# Patient Record
Sex: Female | Born: 1970 | Race: White | Hispanic: No | Marital: Married | Smoking: Never smoker
Health system: Southern US, Community
[De-identification: ages and names within clinical notes are randomized; demographics above are authoritative.]

## PROBLEM LIST (undated history)

## (undated) DIAGNOSIS — E063 Autoimmune thyroiditis: Secondary | ICD-10-CM

## (undated) DIAGNOSIS — N289 Disorder of kidney and ureter, unspecified: Secondary | ICD-10-CM

## (undated) HISTORY — PX: KNEE SURGERY: SHX244

## (undated) HISTORY — PX: SHOULDER SURGERY: SHX246

---

## 2006-04-19 ENCOUNTER — Ambulatory Visit (HOSPITAL_COMMUNITY): Admission: RE | Admit: 2006-04-19 | Discharge: 2006-04-19 | Payer: Self-pay

## 2006-04-21 ENCOUNTER — Ambulatory Visit (HOSPITAL_COMMUNITY): Admission: RE | Admit: 2006-04-21 | Discharge: 2006-04-21 | Payer: Self-pay

## 2006-05-03 ENCOUNTER — Ambulatory Visit (HOSPITAL_COMMUNITY): Admission: RE | Admit: 2006-05-03 | Discharge: 2006-05-03 | Payer: Self-pay

## 2006-05-19 ENCOUNTER — Ambulatory Visit (HOSPITAL_COMMUNITY): Admission: RE | Admit: 2006-05-19 | Discharge: 2006-05-19 | Payer: Self-pay

## 2007-09-17 ENCOUNTER — Encounter: Admission: RE | Admit: 2007-09-17 | Discharge: 2007-09-17 | Payer: Self-pay | Admitting: Sports Medicine

## 2007-10-09 ENCOUNTER — Encounter: Admission: RE | Admit: 2007-10-09 | Discharge: 2007-10-09 | Payer: Self-pay | Admitting: Sports Medicine

## 2008-01-19 ENCOUNTER — Emergency Department (HOSPITAL_COMMUNITY): Admission: EM | Admit: 2008-01-19 | Discharge: 2008-01-19 | Payer: Self-pay | Admitting: Emergency Medicine

## 2008-01-28 ENCOUNTER — Ambulatory Visit (HOSPITAL_COMMUNITY): Admission: RE | Admit: 2008-01-28 | Discharge: 2008-01-28 | Payer: Self-pay | Admitting: Urology

## 2008-03-21 ENCOUNTER — Encounter: Admission: RE | Admit: 2008-03-21 | Discharge: 2008-03-21 | Payer: Self-pay | Admitting: Sports Medicine

## 2009-03-15 DIAGNOSIS — S6990XA Unspecified injury of unspecified wrist, hand and finger(s), initial encounter: Secondary | ICD-10-CM

## 2009-03-15 DIAGNOSIS — S6980XA Other specified injuries of unspecified wrist, hand and finger(s), initial encounter: Secondary | ICD-10-CM | POA: Insufficient documentation

## 2009-03-30 DIAGNOSIS — A311 Cutaneous mycobacterial infection: Secondary | ICD-10-CM

## 2009-04-22 ENCOUNTER — Encounter (INDEPENDENT_AMBULATORY_CARE_PROVIDER_SITE_OTHER): Payer: Self-pay | Admitting: *Deleted

## 2009-04-27 ENCOUNTER — Ambulatory Visit: Payer: Self-pay | Admitting: Infectious Diseases

## 2009-04-27 DIAGNOSIS — E063 Autoimmune thyroiditis: Secondary | ICD-10-CM | POA: Insufficient documentation

## 2009-04-27 DIAGNOSIS — Z87442 Personal history of urinary calculi: Secondary | ICD-10-CM | POA: Insufficient documentation

## 2010-03-28 ENCOUNTER — Encounter: Payer: Self-pay | Admitting: Urology

## 2010-04-06 NOTE — Miscellaneous (Signed)
Summary: Problems and Allergies Updated  Clinical Lists Changes  Problems: Added new problem of INJURY OTHER AND UNSPECIFIED FINGER (ICD-959.5) - right index finger Allergies: Added new allergy or adverse reaction of * GLUTEN Observations: Added new observation of NKA: F (04/22/2009 9:50)  Phone call to Dr. Ronie Spies office requesting culture results when available, medications and most recent o\OV notes. Jennet Maduro RN  April 22, 2009 10:06 AM

## 2010-04-06 NOTE — Miscellaneous (Signed)
Summary: HIPAA Restrictions  HIPAA Restrictions   Imported By: Florinda Marker 04/27/2009 14:56:34  _____________________________________________________________________  External Attachment:    Type:   Image     Comment:   External Document

## 2010-04-06 NOTE — Assessment & Plan Note (Signed)
Summary: new pt mycobacterium infection   CC:  nw patient / mycobacterium infection.  History of Present Illness: 40 yo F with hx of injury to R index finger 03-15-09 while shucking oysters. She underwent I & D of this on 03-30-09. Cx's all negative. Has some limitation of movement. Has some persistent soreness and tingling. Some distal purplish since surgery. Never had any proximal swelling or erythema. initially had swelling and redness, no ability to flex. No fever or chills. Had x-ray and was noted to have retained pieces of shell in her skin. Took 5 days of augmentin no improvement after having wound closed without irrigation/debridement per pt.   Preventive Screening-Counseling & Management  Alcohol-Tobacco     Alcohol drinks/day: occassionally     Alcohol type: mixed drink     Smoking Status: never  Caffeine-Diet-Exercise     Caffeine use/day: 0     Does Patient Exercise: yes     Type of exercise: walking     Exercise (avg: min/session): 30-60     Times/week: 5  Safety-Violence-Falls     Seat Belt Use: yes   Current Allergies (reviewed today): ! * GLUTEN ! 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  Married has german shepperd.   Review of Systems       lost 30#, nl BM, nl urination, no lymphadenopathy   Vital Signs:  Patient profile:   40 year old female Height:      61 inches (154.94 cm) Weight:      226.7 pounds (103.05 kg) BMI:     42.99 Temp:     97.7 degrees F (36.50 degrees C) oral Pulse rate:   66 / minute BP sitting:   107 / 77  (left arm)  Vitals Entered By: Baxter Hire) (April 27, 2009 9:39 AM) CC: nw patient / mycobacterium infection Pain Assessment Patient in pain? yes     Location: right index Intensity: 4 Type: soreness,sharp Onset of pain  Constant  tingling Nutritional Status BMI of > 30 = obese Nutritional Status Detail appetite is okay per patient  Does patient need assistance? Functional Status Self care Ambulation Normal   Physical Exam  General:  well-developed, well-nourished, and well-hydrated.   Eyes:  pupils equal, pupils round, and pupils reactive to light.   Mouth:  pharynx pink and moist and no exudates.   Neck:  no masses.   Lungs:  normal respiratory effort and normal breath sounds.   Heart:  normal rate, regular rhythm, and no murmur.   Abdomen:  soft, non-tender, and normal bowel sounds.   Extremities:  no edema.  R index finger- mild swelling, no tenderness or fluctuance, no proximal LAN. hyperesthesia.    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 Explained to pt difficulty in growing this organism and un-likelihood that cx will grow. cautioned her re: sun exposure, OCP use while on antibiotics. await final on cx, so far ngtd. she will return to clinic 2-3 months.   Orders: Consultation Level IV ([address])  Medications Added to Medication List This Visit: 1)  Doxycycline Hyclate 100 Mg Caps (Doxycycline hyclate) .... Take 1 tablet by mouth two times a day Prescriptions: DOXYCYCLINE HYCLATE 100 MG CAPS (DOXYCYCLINE HYCLATE) Take 1 tablet  by mouth two times a day  #60 x 2   Entered and Authorized by:   Johny Sax MD   Signed by:   Johny Sax MD on 04/27/2009   Method used:   Electronically to        [facility] [address] [phone]* (retail)       [address]       [place], [place]  [address]       Ph: [phone] or [phone]       Fax: [phone]   RxID:   [id number]

## 2010-04-27 ENCOUNTER — Other Ambulatory Visit: Payer: Self-pay | Admitting: Otolaryngology

## 2010-04-27 DIAGNOSIS — J3800 Paralysis of vocal cords and larynx, unspecified: Secondary | ICD-10-CM

## 2010-04-27 DIAGNOSIS — E049 Nontoxic goiter, unspecified: Secondary | ICD-10-CM

## 2010-04-28 ENCOUNTER — Ambulatory Visit
Admission: RE | Admit: 2010-04-28 | Discharge: 2010-04-28 | Disposition: A | Discharge status: Home / Self Care | Payer: BC Managed Care – PPO | Source: Ambulatory Visit | Attending: Otolaryngology | Admitting: Otolaryngology

## 2010-04-28 ENCOUNTER — Inpatient Hospital Stay: Admission: RE | Admit: 2010-04-28 | Payer: Self-pay | Source: Ambulatory Visit

## 2010-04-28 DIAGNOSIS — E049 Nontoxic goiter, unspecified: Secondary | ICD-10-CM

## 2010-04-28 DIAGNOSIS — J3800 Paralysis of vocal cords and larynx, unspecified: Secondary | ICD-10-CM

## 2010-05-05 ENCOUNTER — Other Ambulatory Visit: Payer: Self-pay | Admitting: Otolaryngology

## 2010-05-05 DIAGNOSIS — E041 Nontoxic single thyroid nodule: Secondary | ICD-10-CM

## 2010-05-12 ENCOUNTER — Other Ambulatory Visit: Payer: Self-pay | Admitting: Interventional Radiology

## 2010-05-12 ENCOUNTER — Other Ambulatory Visit (HOSPITAL_COMMUNITY)
Admission: RE | Admit: 2010-05-12 | Discharge: 2010-05-12 | Disposition: A | Discharge status: Home / Self Care | Payer: BC Managed Care – PPO | Source: Ambulatory Visit | Attending: Interventional Radiology | Admitting: Interventional Radiology

## 2010-05-12 ENCOUNTER — Ambulatory Visit
Admission: RE | Admit: 2010-05-12 | Discharge: 2010-05-12 | Disposition: A | Discharge status: Home / Self Care | Payer: BC Managed Care – PPO | Source: Ambulatory Visit | Attending: Otolaryngology | Admitting: Otolaryngology

## 2010-05-12 DIAGNOSIS — E041 Nontoxic single thyroid nodule: Secondary | ICD-10-CM

## 2010-05-12 DIAGNOSIS — E049 Nontoxic goiter, unspecified: Secondary | ICD-10-CM | POA: Insufficient documentation

## 2010-12-08 LAB — PREGNANCY, URINE: Preg Test, Ur: NEGATIVE

## 2017-02-08 ENCOUNTER — Emergency Department (HOSPITAL_COMMUNITY): Payer: BLUE CROSS/BLUE SHIELD

## 2017-02-08 ENCOUNTER — Emergency Department (HOSPITAL_COMMUNITY)
Admission: EM | Admit: 2017-02-08 | Discharge: 2017-02-08 | Disposition: A | Discharge status: Home / Self Care | Payer: BLUE CROSS/BLUE SHIELD | Attending: Emergency Medicine | Admitting: Emergency Medicine

## 2017-02-08 ENCOUNTER — Encounter (HOSPITAL_COMMUNITY): Payer: Self-pay | Admitting: Emergency Medicine

## 2017-02-08 DIAGNOSIS — N12 Tubulo-interstitial nephritis, not specified as acute or chronic: Secondary | ICD-10-CM | POA: Diagnosis not present

## 2017-02-08 DIAGNOSIS — R109 Unspecified abdominal pain: Secondary | ICD-10-CM | POA: Diagnosis present

## 2017-02-08 LAB — CBC
HCT: 37 % (ref 36.0–46.0)
HEMOGLOBIN: 12.5 g/dL (ref 12.0–15.0)
MCH: 30.5 pg (ref 26.0–34.0)
MCHC: 33.8 g/dL (ref 30.0–36.0)
MCV: 90.2 fL (ref 78.0–100.0)
Platelets: 292 10*3/uL (ref 150–400)
RBC: 4.1 MIL/uL (ref 3.87–5.11)
RDW: 13.4 % (ref 11.5–15.5)
WBC: 21.3 10*3/uL — ABNORMAL HIGH (ref 4.0–10.5)

## 2017-02-08 LAB — COMPREHENSIVE METABOLIC PANEL
ALT: 30 U/L (ref 14–54)
ANION GAP: 8 (ref 5–15)
AST: 24 U/L (ref 15–41)
Albumin: 3.7 g/dL (ref 3.5–5.0)
Alkaline Phosphatase: 67 U/L (ref 38–126)
BUN: 12 mg/dL (ref 6–20)
CALCIUM: 9.5 mg/dL (ref 8.9–10.3)
CHLORIDE: 103 mmol/L (ref 101–111)
CO2: 20 mmol/L — AB (ref 22–32)
Creatinine, Ser: 0.71 mg/dL (ref 0.44–1.00)
GFR calc non Af Amer: >60 mL/min (ref >60)
Glucose, Bld: 151 mg/dL — ABNORMAL HIGH (ref 65–99)
POTASSIUM: 3.8 mmol/L (ref 3.5–5.1)
SODIUM: 131 mmol/L — AB (ref 135–145)
Total Bilirubin: 1.6 mg/dL — ABNORMAL HIGH (ref 0.3–1.2)
Total Protein: 6.8 g/dL (ref 6.5–8.1)

## 2017-02-08 LAB — URINALYSIS, ROUTINE W REFLEX MICROSCOPIC
BILIRUBIN URINE: NEGATIVE
GLUCOSE, UA: NEGATIVE mg/dL
KETONES UR: NEGATIVE mg/dL
Nitrite: NEGATIVE
PH: 6 (ref 5.0–8.0)
Protein, ur: 100 mg/dL — AB
SPECIFIC GRAVITY, URINE: 1.014 (ref 1.005–1.030)

## 2017-02-08 LAB — LIPASE, BLOOD: LIPASE: 23 U/L (ref 11–51)

## 2017-02-08 LAB — POC URINE PREG, ED: Preg Test, Ur: NEGATIVE

## 2017-02-08 MED ORDER — DEXTROSE 5 % IV SOLN
1.0000 g | Freq: Once | INTRAVENOUS | Status: AC
  Administered 2017-02-08: 1 g via INTRAVENOUS
  Filled 2017-02-08: qty 10

## 2017-02-08 MED ORDER — SODIUM CHLORIDE 0.9 % IV BOLUS (SEPSIS)
1000.0000 mL | Freq: Once | INTRAVENOUS | Status: AC
  Administered 2017-02-08: 1000 mL via INTRAVENOUS

## 2017-02-08 MED ORDER — ONDANSETRON 8 MG PO TBDP: 8.0000 mg | ORAL_TABLET | Freq: Three times a day (TID) | ORAL | 0 refills | Status: DC | PRN

## 2017-02-08 MED ORDER — KETOROLAC TROMETHAMINE 30 MG/ML IJ SOLN
30.0000 mg | Freq: Once | INTRAMUSCULAR | Status: AC
  Administered 2017-02-08: 30 mg via INTRAVENOUS
  Filled 2017-02-08: qty 1

## 2017-02-08 MED ORDER — CEPHALEXIN 500 MG PO CAPS: 500.0000 mg | ORAL_CAPSULE | Freq: Four times a day (QID) | ORAL | 0 refills | Status: DC

## 2017-02-08 MED ORDER — ONDANSETRON 4 MG PO TBDP: 4.0000 mg | ORAL_TABLET | Freq: Once | ORAL | Status: DC | PRN

## 2017-02-08 MED ORDER — ONDANSETRON HCL 4 MG/2ML IJ SOLN
4.0000 mg | Freq: Once | INTRAMUSCULAR | Status: AC
  Administered 2017-02-08: 4 mg via INTRAVENOUS
  Filled 2017-02-08: qty 2

## 2017-02-08 NOTE — ED Provider Notes (Signed)
[facility]-EMERGENCY DEPT Provider Note   CSN: [id number] Arrival date & time: 02/08/17  0913     History   Chief Complaint Chief Complaint  Patient presents with  . Flank Pain    HPI Denise ScalesSherrie Sellers is a 46 y.o. female.  HPI   Denise Sellers is a 46 y.o. female, with a history of Hashimoto's thyroiditis and kidney stones, presenting to the ED with left lower back and flank discomfort accompanied by nausea and vomiting beginning last night. N/V beginning again last night with about 5 episodes of emesis last 24 hours. Has left lower back discomfort, "like someone is tapping on the back" or squeezing, 6/10, radiating to the buttocks, waxes and wanes.  States she has had kidney stones before, but her current symptoms are more consistent with her recent infection. Endorses productive cough and body aches for the past week.   Recent history: November 17: patient began with nausea and vomiting November 19: patient was [place] office in [place], dx with "bilateral kidney infection."  States pyelonephritis sounds familiar. Was prescribed cipro, took for 2 days, made her nauseous, switched to amox, took for 10 days.  November 23: Patient was reexamined at the PCP office and they stated she showed improvement.   November 28: States labs were retested and signs of infection had cleared. November 30: Finished antibiotic therapy.  Denies abdominal pain, fever/chills, diarrhea, hematochezia/melena, shortness of breath, chest pain, dysuria, hematuria, or any other complaints.    History reviewed. No pertinent past medical history.  Patient Active Problem List   Diagnosis Date Noted  . HASHIMOTO'S THYROIDITIS 04/27/2009  . NEPHROLITHIASIS, HX OF 04/27/2009  . 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 INJURY OTHER AND UNSPECIFIED FINGER 03/15/2009    Past Surgical History:  Procedure Laterality Date  . CESAREAN SECTION    . KNEE SURGERY     . SHOULDER SURGERY      OB History    No data available       Home Medications    Prior to Admission medications   Medication Sig Start Date End Date Taking? Authorizing Provider  ibuprofen (ADVIL,MOTRIN) 200 MG tablet Take 200 mg by mouth every 6 (six) hours as needed for fever, headache, mild pain, moderate pain or cramping.   Yes [provider]  cephALEXin (KEFLEX) 500 MG capsule Take 1 capsule (500 mg total) by mouth 4 (four) times daily for 10 days. 02/08/17 02/18/17  Devonda Pequignot C, PA-C  ondansetron (ZOFRAN ODT) 8 MG disintegrating tablet Take 1 tablet (8 mg total) by mouth every 8 (eight) hours as needed for nausea or vomiting. 02/08/17   Anselm PancoastJoy, Joeli Fenner C, PA-C    Family History No family history on file.  Social History Social History   Tobacco Use  . Smoking status: Not on file  Substance Use Topics  . Alcohol use: Not on file  . Drug use: Not on file     Allergies   Acetaminophen; Gluten meal; Ciprofloxacin; Dairy aid [lactase]; and Eggs or egg-derived products   Review of Systems Review of Systems  Constitutional: Negative for chills and fever.  Respiratory: Positive for cough. Negative for shortness of breath.   Cardiovascular: Negative for chest pain.  Gastrointestinal: Positive for nausea and vomiting. Negative for abdominal pain, blood in stool and diarrhea.  Genitourinary: Positive for flank pain. Negative for dysuria and hematuria.  Musculoskeletal: Positive for back pain and myalgias.  Neurological: Negative for weakness and numbness.  All other systems reviewed and are negative.    Physical Exam Updated Vital Signs BP 110/78 (BP Location: Left Arm)   Pulse (!) 108   Temp 99.7 F (37.6 C) (Oral)   Resp 18   LMP 01/30/2017   SpO2 95%   Physical Exam  Constitutional: She appears well-developed and well-nourished. No distress.  Patient is sitting up in the bed in no apparent distress.  HENT:  Head: Normocephalic and atraumatic.    Eyes: Conjunctivae are normal.  Neck: Neck supple.  Cardiovascular: Normal rate, regular rhythm, normal heart sounds and intact distal pulses.  Pulmonary/Chest: Effort normal and breath sounds normal. No respiratory distress.  No increased work of breathing.  Patient speaks in full sentences without difficulty.  Abdominal: Soft. There is no tenderness. There is no guarding and no CVA tenderness.  Patient changes position without noted hesitation or difficulty.  Musculoskeletal: She exhibits no edema.  Lymphadenopathy:    She has no cervical adenopathy.  Neurological: She is alert.  Skin: Skin is warm and dry. She is not diaphoretic.  Psychiatric: She has a normal mood and affect. Her behavior is normal.  Nursing note and vitals reviewed.    ED Treatments / Results  Labs (all labs ordered are listed, but only abnormal results are displayed) Labs Reviewed  URINALYSIS, ROUTINE W REFLEX MICROSCOPIC - Abnormal; Notable for the following components:      Result Value   Hgb urine dipstick SMALL (*)    Protein, ur 100 (*)    Leukocytes, UA SMALL (*)    Bacteria, UA MANY (*)    Squamous Epithelial / LPF 0-5 (*)    All other components within normal limits  COMPREHENSIVE METABOLIC PANEL - Abnormal; Notable for the following components:   Sodium 131 (*)    CO2 20 (*)    Glucose, Bld 151 (*)    Total Bilirubin 1.6 (*)    All other components within normal limits  CBC - Abnormal; Notable for the following components:   WBC 21.3 (*)    All other components within normal limits  URINE CULTURE  LIPASE, BLOOD  POC URINE PREG, ED    EKG  EKG Interpretation None       Radiology Dg Chest 2 View  Result Date: 02/08/2017 CLINICAL DATA:  Productive cough chest pain EXAM: CHEST  2 VIEW COMPARISON:  04/28/2010 FINDINGS: Decreased lung volume with mild bibasilar atelectasis. Negative for pneumonia or effusion. Negative for heart failure. IMPRESSION: Mild bibasilar atelectasis.  Electronically Signed   By: Marlan Palau M.D.   On: 02/08/2017 10:45   Ct Renal Stone Study  Result Date: 02/08/2017 CLINICAL DATA:  Left-sided flank pain and nausea beginning yesterday. EXAM: CT ABDOMEN AND PELVIS WITHOUT CONTRAST TECHNIQUE: Multidetector CT imaging of the abdomen and pelvis was performed following the standard protocol without IV contrast. COMPARISON:  01/19/2008 FINDINGS: Lower chest: No acute findings. Hepatobiliary: No mass visualized on this unenhanced exam. Gallbladder is unremarkable. Pancreas: No mass or inflammatory process visualized on this unenhanced exam. Spleen:  Within normal limits in size. Adrenals/Urinary tract: Bilateral renal calculi are seen, largest in upper pole of left kidney measuring 11 mm. No evidence of ureteral or bladder calculi. No evidence of hydroureteronephrosis. Left renal swelling and perinephric stranding seen is seen, raising suspicion for pyelonephritis in the absence of ureteral calculi or hydronephrosis. Stomach/Bowel: No evidence of obstruction, inflammatory process, or abnormal fluid collections. Normal appendix visualized. Vascular/Lymphatic: No pathologically enlarged lymph nodes identified. No evidence of abdominal  aortic aneurysm. Reproductive: Unremarkable uterus. 3.8 cm left ovarian cyst is seen, most likely physiologic. Trace amount of free fluid in pelvic cul-de-sac. Other:  None. Musculoskeletal:  No suspicious bone lesions identified. IMPRESSION: Bilateral renal calculi. No evidence of ureteral calculi or hydronephrosis. Left renal swelling and perinephric stranding, suspicious for pyelonephritis. Recommend correlation with urinalysis. 3.8 cm left ovarian cyst, likely physiologic in a reproductive age female. Electronically Signed   By: Myles RosenthalJohn  Stahl M.D.   On: 02/08/2017 12:22    Procedures Procedures (including critical care time)  Medications Ordered in ED Medications  sodium chloride 0.9 % bolus 1,000 mL (0 mLs Intravenous Stopped  02/08/17 1153)  ondansetron (ZOFRAN) injection 4 mg (4 mg Intravenous Given 02/08/17 1020)  ketorolac (TORADOL) 30 MG/ML injection 30 mg (30 mg Intravenous Given 02/08/17 1020)  sodium chloride 0.9 % bolus 1,000 mL (0 mLs Intravenous Stopped 02/08/17 1340)  cefTRIAXone (ROCEPHIN) 1 g in dextrose 5 % 50 mL IVPB (0 g Intravenous Stopped 02/08/17 1222)     Initial Impression / Assessment and Plan / ED Course  I have reviewed the triage vital signs and the nursing notes.  Pertinent labs & imaging results that were available during my care of the patient were reviewed by me and considered in my medical decision making (see chart for details).  Clinical Course as of Feb 09 1343  Wed Feb 08, 2017  1120 Discussed lab results and CXR. States her discomfort and nausea have resolved.  Discussed plan for CT renal study. Agrees to the plan.   [SJ]    Clinical Course User Index [SJ] Lashai Grosch C, PA-C    Patient presents with left lower back pain and vomiting.  Evidence of infection on UA with confirmation of pyelonephritis on CT.  No noted ureteral stone.  Suspect possible reason for recurrence of the patient's infection is the antibiotic choice initiated. Patient is nontoxic appearing, afebrile, not tachycardic on my exam, not tachypneic, not hypotensive, maintains adequate SPO2 on room air, and is in no apparent distress.  Plan for discharge with more appropriate oral antibiotic as well as return in 48 hours for reassessment to assure improvement and no signs of progression of infection. The patient was given additional instructions for home care as well as return precautions. Patient voices understanding of these instructions, accepts the plan, and is comfortable with discharge.  Findings and plan of care discussed with Azalia BilisKevin Campos, MD. Dr. Patria Maneampos personally evaluated and examined this patient.  Vitals:   02/08/17 0923 02/08/17 1158 02/08/17 1342  BP: 110/78 (!) 104/54 105/88  Pulse: (!) 108 84 86    Resp: 18 18 16   Temp: 99.7 F (37.6 C)  98.9 F (37.2 C)  TempSrc: Oral  Oral  SpO2: 95% 98% 100%    Final Clinical Impressions(s) / ED Diagnoses   Final diagnoses:  Pyelonephritis    ED Discharge Orders        Ordered    ondansetron (ZOFRAN ODT) 8 MG disintegrating tablet  Every 8 hours PRN     02/08/17 1332    cephALEXin (KEFLEX) 500 MG capsule  4 times daily     02/08/17 1332       Concepcion LivingJoy, Guy Toney C, PA-C 02/08/17 1352    Azalia Bilisampos, Kevin, MD 02/09/17 [phone]

## 2017-02-08 NOTE — ED Notes (Signed)
ED Provider at bedside. 

## 2017-02-08 NOTE — Discharge Instructions (Addendum)
There is evidence of an infection in the left kidney called pyelonephritis.  Please take all of your antibiotics until finished!   You may develop abdominal discomfort or diarrhea from the antibiotic.  You may help offset this with probiotics which you can buy or get in yogurt. Do not eat or take the probiotics until 2 hours after your antibiotic.  Hydration: Symptoms will be intensified and complicated by dehydration. Dehydration can also extend the duration of symptoms. Drink plenty of fluids and get plenty of rest. You should be drinking at least half a liter of water an hour to stay hydrated. Electrolyte drinks (ex. Gatorade, Powerade, Pedialyte) are also encouraged. You should be drinking enough fluids to make your urine light yellow, almost clear. If this is not the case, you are not drinking enough water. Please note that some of the treatments indicated below will not be effective if you are not adequately hydrated. Pain or fever: Ibuprofen, Naproxen, or Tylenol for pain or fever.  Nausea/vomiting: Use the Zofran for nausea or vomiting.  Follow up: Follow up with a primary care provider or your urologist, as needed, for any future management of this issue.  Return: Return to the ED for reassessment in 48 hours.  Return at any time should symptoms worsen.

## 2017-02-08 NOTE — ED Triage Notes (Signed)
Patient c/o left sided flank pain with nausea since last night. Reports taking amoxicillin x2 weeks ago for bilateral kidney infection. Reports scan showed "multiple stones in left kidney."

## 2017-02-08 NOTE — ED Notes (Signed)
Patient transported to X-ray 

## 2017-02-08 NOTE — ED Notes (Signed)
Patient actively vomiting in triage. Attempted to administer Zofran. Patient reports she is "allergic to a nausea medication" but is unable to state which medication.

## 2017-02-10 ENCOUNTER — Emergency Department (HOSPITAL_COMMUNITY): Payer: BLUE CROSS/BLUE SHIELD

## 2017-02-10 ENCOUNTER — Other Ambulatory Visit: Payer: Self-pay

## 2017-02-10 ENCOUNTER — Encounter (HOSPITAL_COMMUNITY): Payer: Self-pay

## 2017-02-10 ENCOUNTER — Emergency Department (HOSPITAL_COMMUNITY)
Admission: EM | Admit: 2017-02-10 | Discharge: 2017-02-10 | Disposition: A | Discharge status: Home / Self Care | Payer: BLUE CROSS/BLUE SHIELD | Attending: Emergency Medicine | Admitting: Emergency Medicine

## 2017-02-10 DIAGNOSIS — N12 Tubulo-interstitial nephritis, not specified as acute or chronic: Secondary | ICD-10-CM

## 2017-02-10 DIAGNOSIS — R51 Headache: Secondary | ICD-10-CM | POA: Insufficient documentation

## 2017-02-10 DIAGNOSIS — N10 Acute tubulo-interstitial nephritis: Secondary | ICD-10-CM | POA: Insufficient documentation

## 2017-02-10 DIAGNOSIS — R110 Nausea: Secondary | ICD-10-CM | POA: Insufficient documentation

## 2017-02-10 DIAGNOSIS — R1032 Left lower quadrant pain: Secondary | ICD-10-CM | POA: Diagnosis present

## 2017-02-10 HISTORY — DX: Disorder of kidney and ureter, unspecified: N28.9

## 2017-02-10 HISTORY — DX: Autoimmune thyroiditis: E06.3

## 2017-02-10 LAB — URINE CULTURE: Culture: 80000 — AB

## 2017-02-10 LAB — CBC WITH DIFFERENTIAL/PLATELET
BASOS ABS: 0 10*3/uL (ref 0.0–0.1)
BASOS PCT: 0 %
Eosinophils Absolute: 0.3 10*3/uL (ref 0.0–0.7)
Eosinophils Relative: 2 %
HEMATOCRIT: 31.4 % — AB (ref 36.0–46.0)
HEMOGLOBIN: 10.7 g/dL — AB (ref 12.0–15.0)
LYMPHS PCT: 6 %
Lymphs Abs: 0.9 10*3/uL (ref 0.7–4.0)
MCH: 30.5 pg (ref 26.0–34.0)
MCHC: 34.1 g/dL (ref 30.0–36.0)
MCV: 89.5 fL (ref 78.0–100.0)
Monocytes Absolute: 1 10*3/uL (ref 0.1–1.0)
Monocytes Relative: 7 %
NEUTROS ABS: 12.5 10*3/uL — AB (ref 1.7–7.7)
NEUTROS PCT: 85 %
Platelets: 265 10*3/uL (ref 150–400)
RBC: 3.51 MIL/uL — AB (ref 3.87–5.11)
RDW: 13.5 % (ref 11.5–15.5)
WBC: 14.7 10*3/uL — AB (ref 4.0–10.5)

## 2017-02-10 LAB — COMPREHENSIVE METABOLIC PANEL
ALBUMIN: 3.5 g/dL (ref 3.5–5.0)
ALT: 23 U/L (ref 14–54)
AST: 21 U/L (ref 15–41)
Alkaline Phosphatase: 84 U/L (ref 38–126)
Anion gap: 9 (ref 5–15)
BILIRUBIN TOTAL: 0.9 mg/dL (ref 0.3–1.2)
BUN: 9 mg/dL (ref 6–20)
CO2: 24 mmol/L (ref 22–32)
CREATININE: 0.58 mg/dL (ref 0.44–1.00)
Calcium: 9.6 mg/dL (ref 8.9–10.3)
Chloride: 102 mmol/L (ref 101–111)
GFR calc Af Amer: >60 mL/min (ref >60)
GLUCOSE: 103 mg/dL — AB (ref 65–99)
POTASSIUM: 3.2 mmol/L — AB (ref 3.5–5.1)
Sodium: 135 mmol/L (ref 135–145)
TOTAL PROTEIN: 7.6 g/dL (ref 6.5–8.1)

## 2017-02-10 LAB — URINALYSIS, ROUTINE W REFLEX MICROSCOPIC
BILIRUBIN URINE: NEGATIVE
Bacteria, UA: NONE SEEN
Glucose, UA: NEGATIVE mg/dL
Ketones, ur: NEGATIVE mg/dL
Nitrite: NEGATIVE
PH: 5 (ref 5.0–8.0)
Protein, ur: 30 mg/dL — AB
SPECIFIC GRAVITY, URINE: 1.015 (ref 1.005–1.030)

## 2017-02-10 LAB — PREGNANCY, URINE: Preg Test, Ur: NEGATIVE

## 2017-02-10 LAB — I-STAT BETA HCG BLOOD, ED (MC, WL, AP ONLY): HCG, QUANTITATIVE: 17.1 m[IU]/mL — AB (ref <5)

## 2017-02-10 LAB — I-STAT CG4 LACTIC ACID, ED: LACTIC ACID, VENOUS: 1.02 mmol/L (ref 0.5–1.9)

## 2017-02-10 MED ORDER — IBUPROFEN 200 MG PO TABS: ORAL_TABLET | ORAL | 0 refills | Status: DC

## 2017-02-10 MED ORDER — DEXTROSE 5 % IV SOLN
1.0000 g | Freq: Once | INTRAVENOUS | Status: AC
  Administered 2017-02-10: 1 g via INTRAVENOUS
  Filled 2017-02-10 (×2): qty 10

## 2017-02-10 MED ORDER — IBUPROFEN 800 MG PO TABS
800.0000 mg | ORAL_TABLET | Freq: Once | ORAL | Status: AC
  Administered 2017-02-10: 800 mg via ORAL
  Filled 2017-02-10: qty 1

## 2017-02-10 MED ORDER — PROCHLORPERAZINE MALEATE 5 MG PO TABS: 5.0000 mg | ORAL_TABLET | Freq: Four times a day (QID) | ORAL | 0 refills | Status: DC | PRN

## 2017-02-10 MED ORDER — SODIUM CHLORIDE 0.9 % IV BOLUS (SEPSIS)
1000.0000 mL | Freq: Once | INTRAVENOUS | Status: AC
  Administered 2017-02-10: 1000 mL via INTRAVENOUS

## 2017-02-10 MED ORDER — HYDROMORPHONE HCL 1 MG/ML IJ SOLN: 1.0000 mg | Freq: Once | INTRAMUSCULAR | Status: DC

## 2017-02-10 MED ORDER — SULFAMETHOXAZOLE-TRIMETHOPRIM 800-160 MG PO TABS: 1.0000 | ORAL_TABLET | Freq: Two times a day (BID) | ORAL | 0 refills | Status: DC

## 2017-02-10 MED ORDER — PROMETHAZINE HCL 25 MG/ML IJ SOLN
25.0000 mg | Freq: Once | INTRAMUSCULAR | Status: AC
  Administered 2017-02-10: 25 mg via INTRAVENOUS
  Filled 2017-02-10: qty 1

## 2017-02-10 NOTE — ED Triage Notes (Signed)
Patient states she is here for a follow up visit from 4 days ago. Patient states she has been passing stones, but continues to have nausea and the zofran is not working. Patient c/o intermittent left flank pain, nausea, and not getting sleep.

## 2017-02-10 NOTE — ED Notes (Signed)
Urology Provider at bedside. 

## 2017-02-10 NOTE — ED Notes (Signed)
Patient verbalized understanding of new RX and follow up instructions.

## 2017-02-10 NOTE — ED Notes (Signed)
Ultrasound at bedside

## 2017-02-10 NOTE — ED Notes (Signed)
Per Dr. Silverio LayYao patient is still to have radiology exam performed.

## 2017-02-10 NOTE — Discharge Instructions (Signed)
Dr. Rica Motehalstedt called in bactrim and compazine to your pharmacy.   Stop taking keflex.   See urology for follow up in a week.   We sent urine and blood cultures and you will be called if they are positive.   Return to ER if you have worse nausea or vomiting or persistent fevers or severe pain.

## 2017-02-10 NOTE — ED Provider Notes (Signed)
[facility]-EMERGENCY DEPT Provider Note   CSN: [id number] Arrival date & time: 02/10/17  [id number]     History   Chief Complaint Chief Complaint  Patient presents with  . Flank Pain    left  . Nausea  . Headache    HPI Denise Sellers is a 46 y.o. female history of Hashimoto's disease, kidney stones here presenting with persistent left flank pain, nausea, chills.  Patient states that she was seen in [place] about 2 weeks ago and finished a course of Cipro.  She was seen in the ED 2 days ago and was diagnosed with recurrent pyelonephritis and was put on Keflex.  Patient had a CT renal stone at that time that showed possible that stranding around the left kidney with no obvious abscess.  Patient had a white blood cell count of 21,000 at that time.  Patient states that she has persistent chills and night sweats for the last several days but did not take her temperature at home.  Patient felt nauseated despite taking Zofran and states that the Keflex makes her jittery at night.  Patient states that she noticed some dark urine and thought she may be passing kidney stones.   The history is provided by the patient.    Past Medical History:  Diagnosis Date  . Hashimoto's disease   . Renal disorder     Patient Active Problem List   Diagnosis Date Noted  . HASHIMOTO'S THYROIDITIS 04/27/2009  . NEPHROLITHIASIS, HX OF 04/27/2009  . 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 INJURY OTHER AND UNSPECIFIED FINGER 03/15/2009    Past Surgical History:  Procedure Laterality Date  . CESAREAN SECTION    . KNEE SURGERY    . SHOULDER SURGERY      OB History    No data available       Home Medications    Prior to Admission medications   Medication Sig Start Date End Date Taking? Authorizing Provider  cephALEXin (KEFLEX) 500 MG capsule Take 1 capsule (500 mg total) by mouth 4 (four) times daily for 10 days. 02/08/17 02/18/17  Joy, Ines BloomerShawn C, PA-C    ibuprofen (ADVIL,MOTRIN) 200 MG tablet Take 200 mg by mouth every 6 (six) hours as needed for fever, headache, mild pain, moderate pain or cramping.    [provider]  ondansetron (ZOFRAN ODT) 8 MG disintegrating tablet Take 1 tablet (8 mg total) by mouth every 8 (eight) hours as needed for nausea or vomiting. 02/08/17   Anselm PancoastJoy, Shawn C, PA-C    Family History History reviewed. No pertinent family history.  Social History Social History   Tobacco Use  . Smoking status: Never Smoker  . Smokeless tobacco: Never Used  Substance Use Topics  . Alcohol use: Yes    Frequency: Never    Comment: occasinally  . Drug use: No     Allergies   Acetaminophen; Gluten meal; Ciprofloxacin; Dairy aid [lactase]; and Eggs or egg-derived products   Review of Systems Review of Systems  Gastrointestinal: Positive for nausea.  Genitourinary: Positive for flank pain.  All other systems reviewed and are negative.    Physical Exam Updated Vital Signs BP (!) 126/54 (BP Location: Left Arm)   Pulse 81   Temp 98.8 F (37.1 C) (Oral)   Resp 16   Ht 5\' 6"  (1.676 m)   Wt 113.4 kg (250 lb)   LMP 01/30/2017   SpO2 100%   BMI 40.35 kg/m   Physical Exam  Constitutional: She is oriented to person, place, and time.  Slightly dehydrated, uncomfortable   HENT:  Head: Normocephalic.  MM slightly dry   Eyes: EOM are normal. Pupils are equal, round, and reactive to light.  Neck: Normal range of motion. Neck supple.  Cardiovascular: Normal rate.  Pulmonary/Chest: Effort normal and breath sounds normal. No respiratory distress. She has no wheezes.  Abdominal: Soft.  Mild L CVAT   Musculoskeletal: Normal range of motion.  Neurological: She is alert and oriented to person, place, and time. She has normal strength.  Skin: Skin is warm.  Psychiatric: She has a normal mood and affect.  Nursing note and vitals reviewed.    ED Treatments / Results  Labs (all labs ordered are listed, but only  abnormal results are displayed) Labs Reviewed  URINE CULTURE  CULTURE, BLOOD (ROUTINE X 2)  CULTURE, BLOOD (ROUTINE X 2)  URINALYSIS, ROUTINE W REFLEX MICROSCOPIC  CBC WITH DIFFERENTIAL/PLATELET  COMPREHENSIVE METABOLIC PANEL  I-STAT BETA HCG BLOOD, ED ([facility], [facility], [facility] ONLY)  I-STAT CG4 LACTIC ACID, ED    EKG  EKG Interpretation None       Radiology Ct Renal Stone Study  Result Date: 02/08/2017 CLINICAL DATA:  Left-sided flank pain and nausea beginning yesterday. EXAM: CT ABDOMEN AND PELVIS WITHOUT CONTRAST TECHNIQUE: Multidetector CT imaging of the abdomen and pelvis was performed following the standard protocol without IV contrast. COMPARISON:  01/19/2008 FINDINGS: Lower chest: No acute findings. Hepatobiliary: No mass visualized on this unenhanced exam. Gallbladder is unremarkable. Pancreas: No mass or inflammatory process visualized on this unenhanced exam. Spleen:  Within normal limits in size. Adrenals/Urinary tract: Bilateral renal calculi are seen, largest in upper pole of left kidney measuring 11 mm. No evidence of ureteral or bladder calculi. No evidence of hydroureteronephrosis. Left renal swelling and perinephric stranding seen is seen, raising suspicion for pyelonephritis in the absence of ureteral calculi or hydronephrosis. Stomach/Bowel: No evidence of obstruction, inflammatory process, or abnormal fluid collections. Normal appendix visualized. Vascular/Lymphatic: No pathologically enlarged lymph nodes identified. No evidence of abdominal aortic aneurysm. Reproductive: Unremarkable uterus. 3.8 cm left ovarian cyst is seen, most likely physiologic. Trace amount of free fluid in pelvic cul-de-sac. Other:  None. Musculoskeletal:  No suspicious bone lesions identified. IMPRESSION: Bilateral renal calculi. No evidence of ureteral calculi or hydronephrosis. Left renal swelling and perinephric stranding, suspicious for pyelonephritis. Recommend correlation with urinalysis. 3.8 cm left  ovarian cyst, likely physiologic in a reproductive age female. Electronically Signed   By: Myles RosenthalJohn  Stahl M.D.   On: 02/08/2017 12:22    Procedures Procedures (including critical care time)  Medications Ordered in ED Medications  sodium chloride 0.9 % bolus 1,000 mL (not administered)  promethazine (PHENERGAN) injection 25 mg (not administered)     Initial Impression / Assessment and Plan / ED Course  I have reviewed the triage vital signs and the nursing notes.  Pertinent labs & imaging results that were available during my care of the patient were reviewed by me and considered in my medical decision making (see chart for details).     Denise Sellers is a 46 y.o. female here with chills, L flank pain. Recent CT showed intra renal stones bilaterally with fat stranding by L kidney suspicious for pyelo. Her urine culture grew out E coli but no sensitivities available. Has persistent chills so consider bacteremia vs renal abscess. Will get labs, US renal stone, lactate, blood and urine cultures. Will give IVF, phenergan and reassess.   2:15 PM WBC dec  to 14 from 21. Lactate nl. Repeat xray showed persistent intrarenal stones, worse on L side. US showed no hydro. UA still showed too many to count WBC but no bacteria. Ordered rocephin. Spiked temp of 102 in the ED. Blood and urine cultures sent. I called Dr. Charlynn Court from urology to see patient.   3:18 PM Dr. Charlynn Court saw patient. He felt that she is not septic. He switched her to bactrim from keflex. Given rocephin in the ED. He also prescribed compazine for nausea and will have her follow up in urology clinic. Blood and urine cultures sent. He felt that she can be discharged home.   Final Clinical Impressions(s) / ED Diagnoses   Final diagnoses:  None    ED Discharge Orders    None       Charlynne Pander, MD 02/10/17 1520

## 2017-02-10 NOTE — Consult Note (Signed)
Urology Consult   Physician requesting consult: Dr Cheri Rous  Reason for consult: Kidney infection, kidney stone  History of Present Illness: Denise Sellers is a 46 y.o. female with a known history of urolithiasis, who has been to the emergency room several times in the past month.  Apparently, she developed food poisoning the week before Thanksgiving, and saw a physician in [place], [place].  Following that, she presented to her integrative medical professional in [place] for a urinary tract infection and was placed on amoxicillin.  She presented here to [facility] 3 days ago with fever, persistent back pain, and had a CT scan performed revealing 1 small right renal stone, several large left renal calculi without evidence of ureteral stone or hydronephrosis.  Urinalysis was consistent with infection, white blood cell count was 21,000.  She was sent home with a 10-day course of Keflex.  She presents today with persistent back pain, intermittent chills, and some intolerance to the Keflex.  She has had persistent nausea.  She has had a renal ultrasound and KUB revealing no evidence of ureteral stone/hydronephrosis.  White blood cell count is down to 14,000.  Lactic acid level is normal.  Urologic consultation is requested.  She did have a kidney stone treated with lithotripsy about 10 years ago.  Past Medical History:  Diagnosis Date  . Hashimoto's disease   . Renal disorder     Past Surgical History:  Procedure Laterality Date  . CESAREAN SECTION    . KNEE SURGERY    . SHOULDER SURGERY       Current Hospital Medications: Scheduled Meds: Continuous Infusions: PRN Meds:.  Allergies:  Allergies  Allergen Reactions  . Acetaminophen Hives, Itching and Swelling    Hives on face and neck  . Gluten Meal Swelling    Swelling on neck  . Ciprofloxacin Nausea And Vomiting  . Dairy Aid [Lactase] Diarrhea and Nausea And Vomiting  . Eggs Or Egg-Derived Products Diarrhea and Nausea  And Vomiting  . Zofran [Ondansetron Hcl] Nausea Only    History reviewed. No pertinent family history.  Social History:  reports that  has never smoked. she has never used smokeless tobacco. She reports that she drinks alcohol. She reports that she does not use drugs.  ROS: A complete review of systems was performed.  All systems are negative except for pertinent findings as noted.  Physical Exam:  Vital signs in last 24 hours: Temp:  [98.8 F (37.1 C)-102 F (38.9 C)] 101.1 F (38.4 C) (12/07 1410) Pulse Rate:  [79-95] 95 (12/07 1416) Resp:  [16-20] 18 (12/07 1416) BP: (112-129)/(54-80) 129/80 (12/07 1416) SpO2:  [96 %-100 %] 100 % (12/07 1416) Weight:  [113.4 kg (250 lb)] 113.4 kg (250 lb) (12/07 0912) General:  Alert and oriented, No acute distress HEENT: Normocephalic, atraumatic Neck: No JVD or lymphadenopathy Cardiovascular: Normal rate Lungs: Normal inspiratory and expiratory excursion Abdomen: Soft, nontender, nondistended, no abdominal masses.  Obese. Back: No CVA tenderness Extremities: No edema Neurologic: Grossly intact  Laboratory Data:  Recent Labs    02/08/17 0938 02/10/17 1037  WBC 21.3* 14.7*  HGB 12.5 10.7*  HCT 37.0 31.4*  PLT 292 265    Recent Labs    02/08/17 0938 02/10/17 1037  NA 131* 135  K 3.8 3.2*  CL 103 102  GLUCOSE 151* 103*  BUN 12 9  CALCIUM 9.5 9.6  CREATININE 0.71 0.58     Results for orders placed or performed during the hospital encounter of 02/10/17 (  from the past 24 hour(s))  Urinalysis, Routine w reflex microscopic- may I&O cath if menses     Status: Abnormal   Collection Time: 02/10/17 10:20 AM  Result Value Ref Range   Color, Urine YELLOW YELLOW   APPearance HAZY (A) CLEAR   Specific Gravity, Urine 1.015 1.005 - 1.030   pH 5.0 5.0 - 8.0   Glucose, UA NEGATIVE NEGATIVE mg/dL   Hgb urine dipstick LARGE (A) NEGATIVE   Bilirubin Urine NEGATIVE NEGATIVE   Ketones, ur NEGATIVE NEGATIVE mg/dL   Protein, ur 30 (A)  NEGATIVE mg/dL   Nitrite NEGATIVE NEGATIVE   Leukocytes, UA MODERATE (A) NEGATIVE   RBC / HPF TOO NUMEROUS TO COUNT 0 - 5 RBC/hpf   WBC, UA TOO NUMEROUS TO COUNT 0 - 5 WBC/hpf   Bacteria, UA NONE SEEN NONE SEEN   Squamous Epithelial / LPF 0-5 (A) NONE SEEN   Mucus PRESENT    Hyaline Casts, UA PRESENT   Pregnancy, urine     Status: None   Collection Time: 02/10/17 10:20 AM  Result Value Ref Range   Preg Test, Ur NEGATIVE NEGATIVE  CBC with Differential/Platelet     Status: Abnormal   Collection Time: 02/10/17 10:37 AM  Result Value Ref Range   WBC 14.7 (H) 4.0 - 10.5 K/uL   RBC 3.51 (L) 3.87 - 5.11 MIL/uL   Hemoglobin 10.7 (L) 12.0 - 15.0 g/dL   HCT 69.6 (L) 29.5 - 28.4 %   MCV 89.5 78.0 - 100.0 fL   MCH 30.5 26.0 - 34.0 pg   MCHC 34.1 30.0 - 36.0 g/dL   RDW 13.2 44.0 - 10.2 %   Platelets 265 150 - 400 K/uL   Neutrophils Relative % 85 %   Neutro Abs 12.5 (H) 1.7 - 7.7 K/uL   Lymphocytes Relative 6 %   Lymphs Abs 0.9 0.7 - 4.0 K/uL   Monocytes Relative 7 %   Monocytes Absolute 1.0 0.1 - 1.0 K/uL   Eosinophils Relative 2 %   Eosinophils Absolute 0.3 0.0 - 0.7 K/uL   Basophils Relative 0 %   Basophils Absolute 0.0 0.0 - 0.1 K/uL  Comprehensive metabolic panel     Status: Abnormal   Collection Time: 02/10/17 10:37 AM  Result Value Ref Range   Sodium 135 135 - 145 mmol/L   Potassium 3.2 (L) 3.5 - 5.1 mmol/L   Chloride 102 101 - 111 mmol/L   CO2 24 22 - 32 mmol/L   Glucose, Bld 103 (H) 65 - 99 mg/dL   BUN 9 6 - 20 mg/dL   Creatinine, Ser 7.25 0.44 - 1.00 mg/dL   Calcium 9.6 8.9 - 36.6 mg/dL   Total Protein 7.6 6.5 - 8.1 g/dL   Albumin 3.5 3.5 - 5.0 g/dL   AST 21 15 - 41 U/L   ALT 23 14 - 54 U/L   Alkaline Phosphatase 84 38 - 126 U/L   Total Bilirubin 0.9 0.3 - 1.2 mg/dL   GFR calc non Af Amer >60 >60 mL/min   GFR calc Af Amer >60 >60 mL/min   Anion gap 9 5 - 15  I-Stat beta hCG blood, ED     Status: Abnormal   Collection Time: 02/10/17 10:49 AM  Result Value Ref  Range   I-stat hCG, quantitative 17.1 (H) <5 mIU/mL   Comment 3          I-Stat CG4 Lactic Acid, ED     Status: None   Collection Time: 02/10/17  10:51 AM  Result Value Ref Range   Lactic Acid, Venous 1.02 0.5 - 1.9 mmol/L   Recent Results (from the past 240 hour(s))  Urine culture     Status: Abnormal   Collection Time: 02/08/17 11:16 AM  Result Value Ref Range Status   Specimen Description URINE, CLEAN CATCH  Final   Special Requests NONE  Final   Culture 80,000 COLONIES/mL ESCHERICHIA COLI (A)  Final   Report Status 02/10/2017 FINAL  Final   Organism ID, Bacteria ESCHERICHIA COLI (A)  Final      Susceptibility   Escherichia coli - MIC*    AMPICILLIN <=2 SENSITIVE Sensitive     CEFAZOLIN <=4 SENSITIVE Sensitive     CEFTRIAXONE <=1 SENSITIVE Sensitive     CIPROFLOXACIN <=0.25 SENSITIVE Sensitive     GENTAMICIN <=1 SENSITIVE Sensitive     IMIPENEM <=0.25 SENSITIVE Sensitive     NITROFURANTOIN <=16 SENSITIVE Sensitive     TRIMETH/SULFA <=20 SENSITIVE Sensitive     AMPICILLIN/SULBACTAM <=2 SENSITIVE Sensitive     PIP/TAZO <=4 SENSITIVE Sensitive     Extended ESBL NEGATIVE Sensitive     * 80,000 COLONIES/mL ESCHERICHIA COLI    Renal Function: Recent Labs    02/08/17 0938 02/10/17 1037  CREATININE 0.71 0.58   Estimated Creatinine Clearance: 114.6 mL/min (by C-G formula based on SCr of 0.58 mg/dL).  Radiologic Imaging: Dg Abdomen 1 View  Result Date: 02/10/2017 CLINICAL DATA:  Abdominal pain.  History of urinary tract stones. EXAM: ABDOMEN - 1 VIEW COMPARISON:  CT abdomen and pelvis 02/08/2017. FINDINGS: Two calcifications are seen in the left kidney consistent with stones visualized on prior CT scan. The larger stone is 1.2 cm. Additional smaller left renal stones are not visible on this exam. Punctate right renal stone seen on CT scan is noted. No evidence of ureteral stone is identified. Large stool burden in the colon noted. IMPRESSION: Bilateral renal stones. Stone burden  is much greater on the left. No evidence of ureteral stone. Large colonic stool burden. Electronically Signed   By: Drusilla Kannerhomas  Dalessio M.D.   On: 02/10/2017 13:45   [place] Renal  Result Date: 02/10/2017 CLINICAL DATA:  Flank pain EXAM: RENAL / URINARY TRACT ULTRASOUND COMPLETE COMPARISON:  CT 02/08/2017 FINDINGS: Right Kidney: Length: 14.3 cm. Previously seen small midpole renal stone by CT not visualized on today's ultrasound. Echogenicity within normal limits. No mass or hydronephrosis visualized. Left Kidney: Length: 16.2 cm. Shadowing 16 mm stone in the upper pole. Other smaller calcifications, nonobstructing. No hydronephrosis. Normal echotexture. Bladder: Appears normal for degree of bladder distention. IMPRESSION: Left nephrolithiasis again noted as seen on prior CT. The punctate right midpole renal stone seen on prior CT cannot be visualized on today's ultrasound. No hydronephrosis. Electronically Signed   By: Charlett NoseKevin  Dover M.D.   On: 02/10/2017 11:13    I independently reviewed the above imaging studies.  I see no evidence of ureteral calculi.  Impression/Assessment:  1.  Probable pyelonephritis.  Overall, the patient does seem to be getting better with improved white blood cell count and no evidence of sepsis.  2.  Left greater than right renal calculi, no evidence of ureteral calculi or obstruction  Plan:  1.  I would recommend switching antibiotics.  I have sent in a prescription for Bactrim DS for 10 days  2.  I have recommended that she take anti-inflammatories on a regular basis.  Perinephric inflammation is best managed with this rather than narcotics  3.  She does  not tolerate ondansetron, so I did send in Compazine  4.  I would expect that she will continue to improve.  I have reassured her about her process.  She can follow-up with her integrative medicine specialist

## 2017-02-11 LAB — URINE CULTURE: CULTURE: NO GROWTH

## 2017-02-12 ENCOUNTER — Telehealth: Payer: Self-pay

## 2017-02-12 NOTE — Telephone Encounter (Signed)
Post ED Visit - Positive Culture Follow-up  Culture report reviewed by antimicrobial stewardship pharmacist:  []  Enzo BiNathan Batchelder, Pharm.D. []  Celedonio MiyamotoJeremy Frens, Pharm.D., BCPS AQ-ID [x]  Garvin FilaMike Maccia, Pharm.D., BCPS []  Georgina PillionElizabeth Martin, Pharm.D., BCPS []  TorontoMinh Pham, [address] BoulevardPharm.D., BCPS, AAHIVP []  Estella HuskMichelle Turner, Pharm.D., BCPS, AAHIVP []  Lysle Pearlachel Rumbarger, PharmD, BCPS []  Casilda Carlsaylor Stone, PharmD, BCPS []  Pollyann SamplesAndy Johnston, PharmD, BCPS  Positive urine culture Treated with Cephalexin, organism sensitive to the same and no further patient follow-up is required at this time.  Jerry CarasCullom, Lynnett Langlinais Burnett 02/12/2017, 11:43 AM

## 2017-02-15 LAB — CULTURE, BLOOD (ROUTINE X 2)
Culture: NO GROWTH
Culture: NO GROWTH
SPECIAL REQUESTS: ADEQUATE
Special Requests: ADEQUATE

## 2018-01-01 IMAGING — CT CT RENAL STONE PROTOCOL
2 of 4 series · 16 of 46 positions shown, 18 images · non-contrast
Comparison: 01/19/2008

CLINICAL DATA: Left-sided flank pain and nausea beginning
yesterday.

EXAM:
CT ABDOMEN AND PELVIS WITHOUT CONTRAST
TECHNIQUE: Multidetector CT imaging of the abdomen and pelvis was performed
following the standard protocol without IV contrast.

[Series 2: axial st · axial · 0.98mm/px · z∈[-562,-67]mm · 13 of 111 slices shown, 15 images]
[im 6/111  soft-tissue]
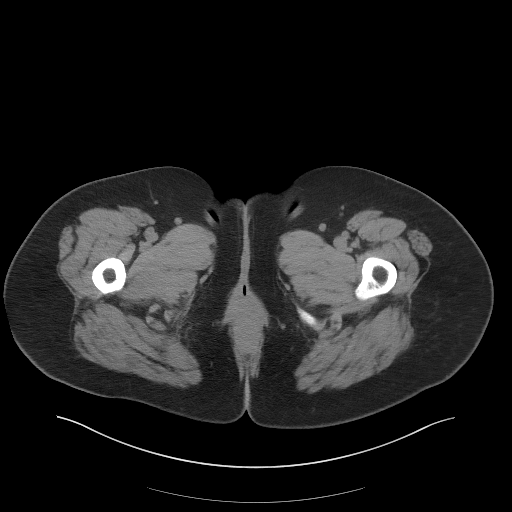
[im 6/111  bone]
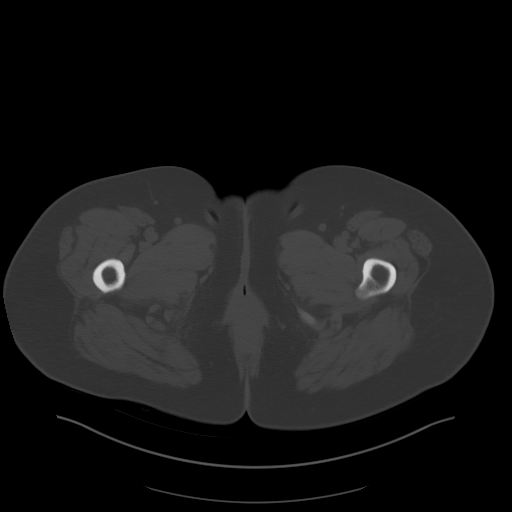
[im 17/111  soft-tissue]
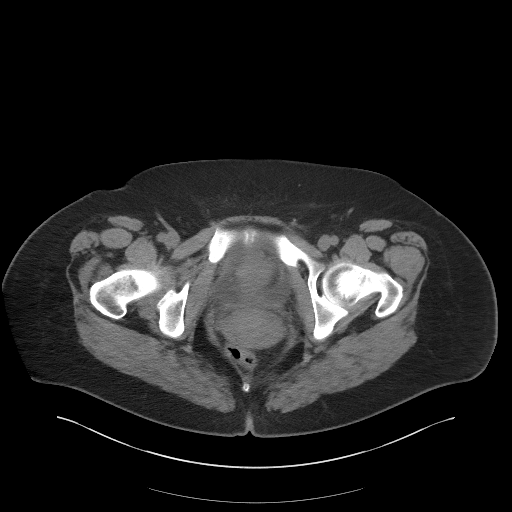
[im 23/111  soft-tissue]
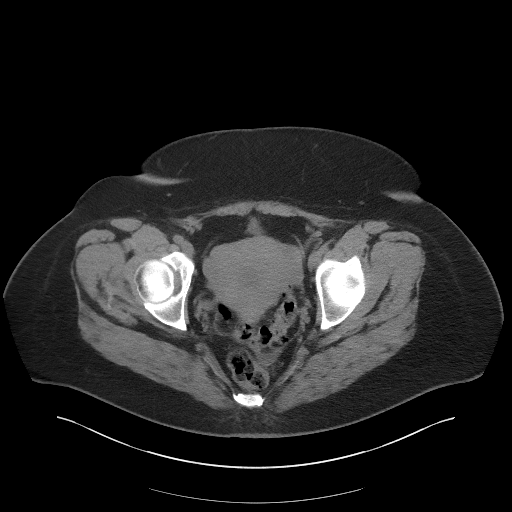
[im 34/111  soft-tissue]
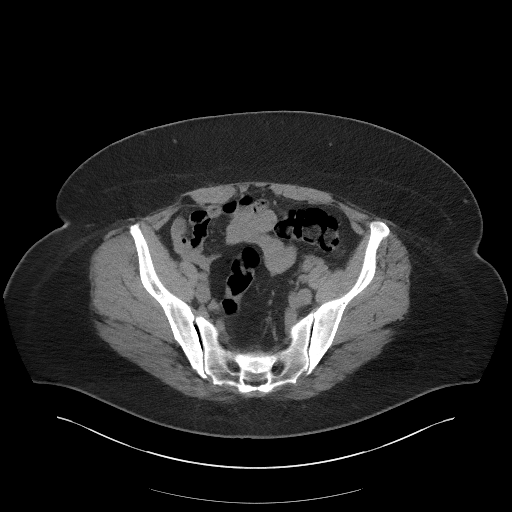
[im 39/111  soft-tissue]
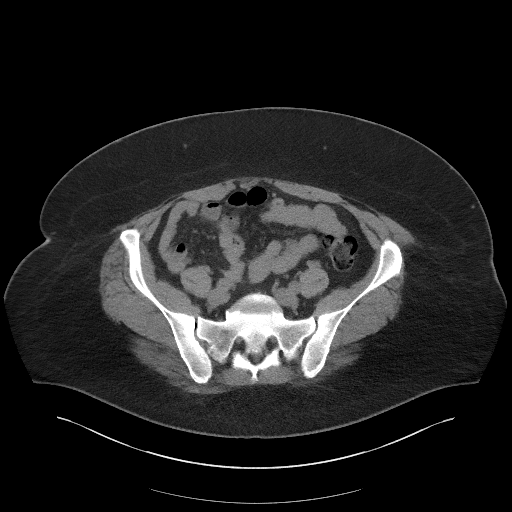
[im 50/111  soft-tissue]
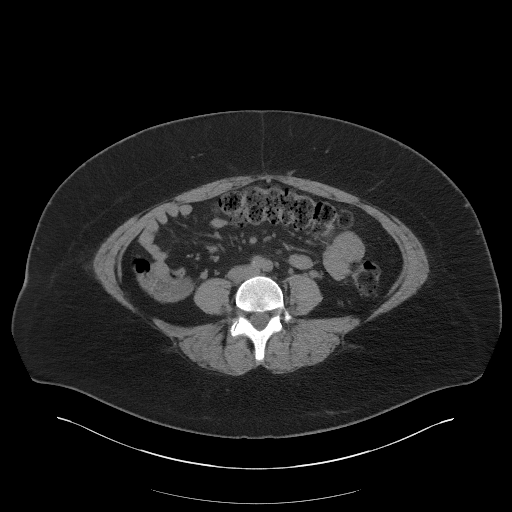
[im 56/111  soft-tissue]
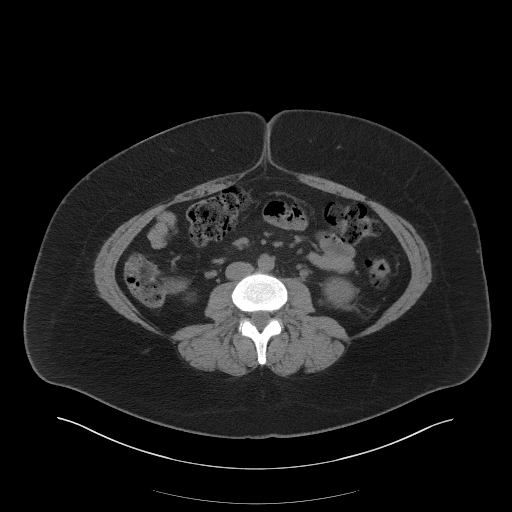
[im 61/111  soft-tissue]
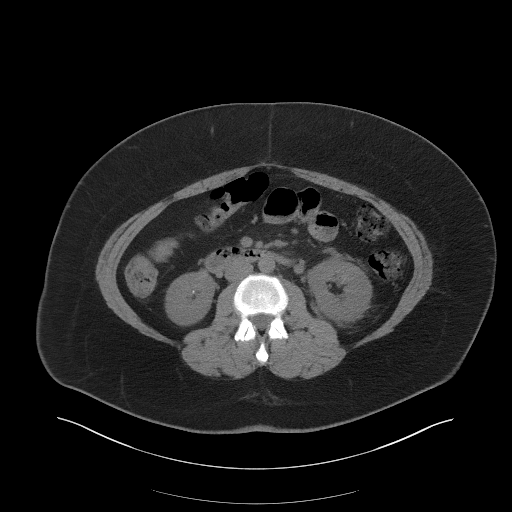
[im 72/111  soft-tissue]
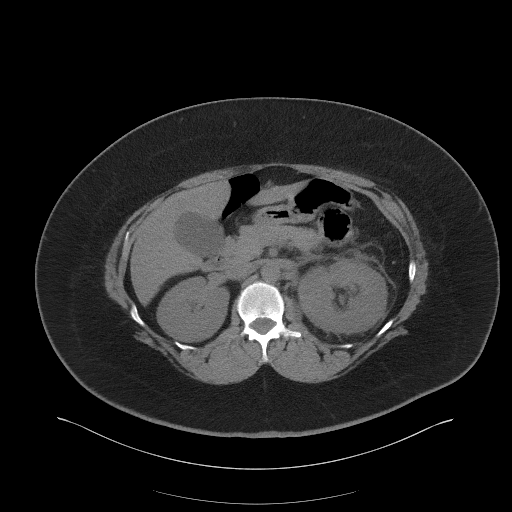
[im 72/111  bone]
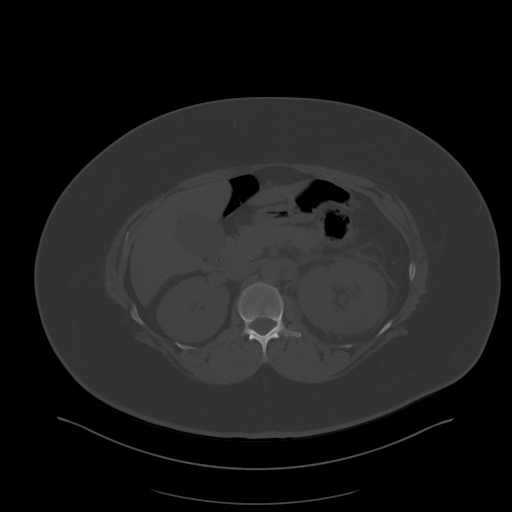
[im 78/111  soft-tissue]
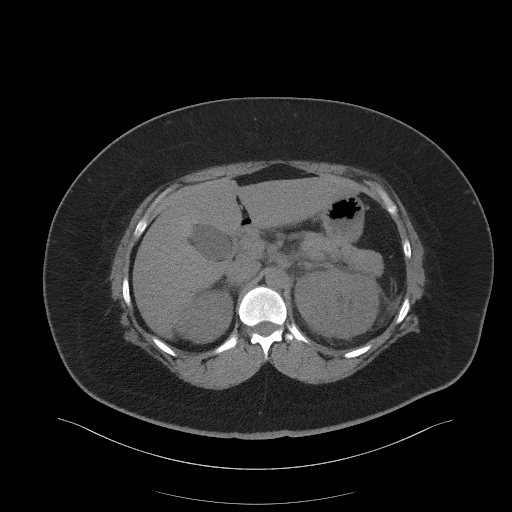
[im 89/111  soft-tissue]
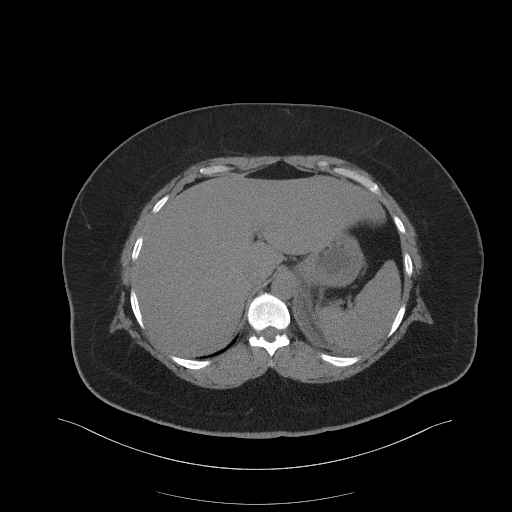
[im 94/111  soft-tissue]
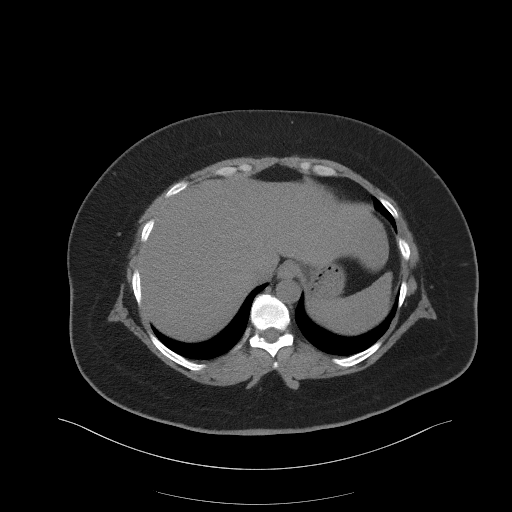
[im 105/111  soft-tissue]
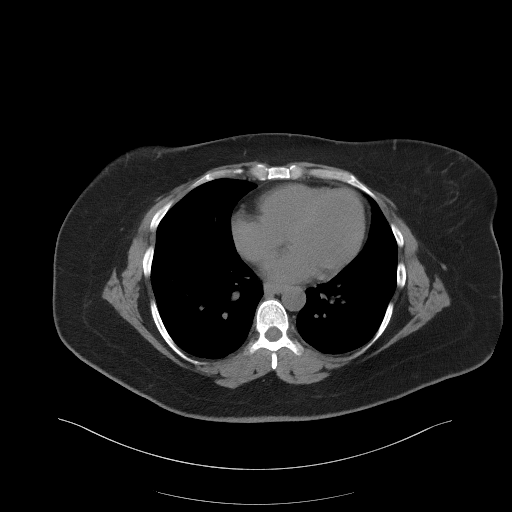

[Series 4: coronal · coronal · 0.93mm/px · 3 of 169 slices shown]
[im 57/169  soft-tissue]
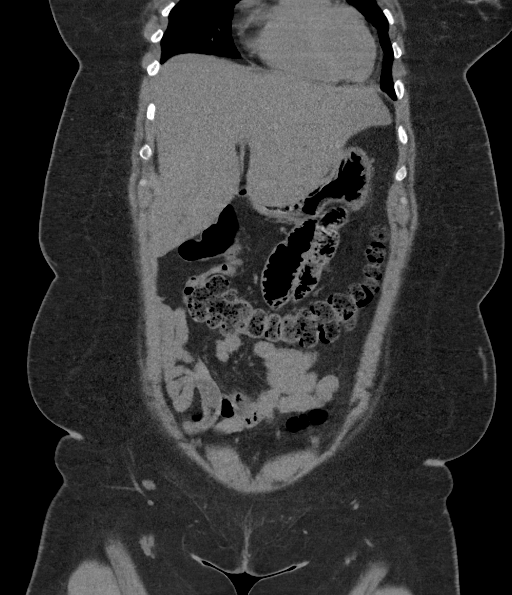
[im 75/169  soft-tissue]
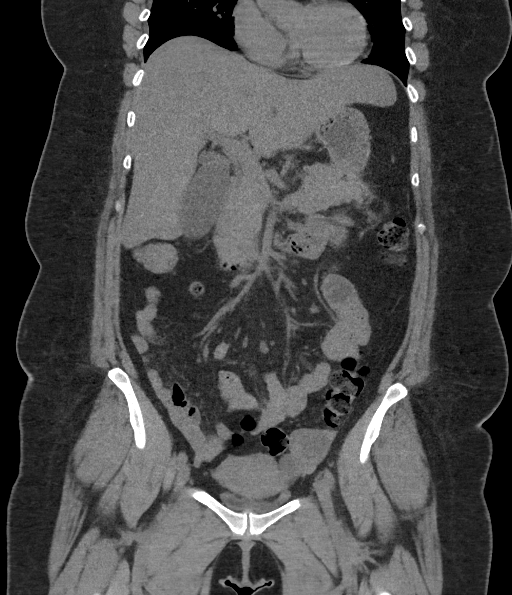
[im 94/169  soft-tissue]
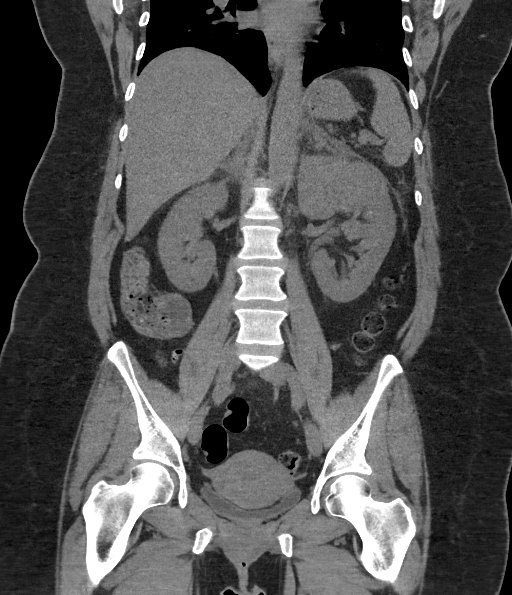

[16 of 46 positions shown; findings below may reference images not displayed]

FINDINGS: Lower chest: No acute findings.

Hepatobiliary: No mass visualized on this unenhanced exam.
Gallbladder is unremarkable.

Pancreas: No mass or inflammatory process visualized on this
unenhanced exam.

Spleen:  Within normal limits in size.

Adrenals/Urinary tract: Bilateral renal calculi are seen, largest in
upper pole of left kidney measuring 11 mm. No evidence of ureteral
or bladder calculi. No evidence of hydroureteronephrosis. Left renal
swelling and perinephric stranding seen is seen, raising suspicion
for pyelonephritis in the absence of ureteral calculi or
hydronephrosis.

Stomach/Bowel: No evidence of obstruction, inflammatory process, or
abnormal fluid collections. Normal appendix visualized.

Vascular/Lymphatic: No pathologically enlarged lymph nodes
identified. No evidence of abdominal aortic aneurysm.

Reproductive: Unremarkable uterus. 3.8 cm left ovarian cyst is seen,
most likely physiologic. Trace amount of free fluid in pelvic
cul-de-sac.

Other:  None.

Musculoskeletal:  No suspicious bone lesions identified.
IMPRESSION: Bilateral renal calculi. No evidence of ureteral calculi or
hydronephrosis.

Left renal swelling and perinephric stranding, suspicious for
pyelonephritis. Recommend correlation with urinalysis.

3.8 cm left ovarian cyst, likely physiologic in a reproductive age
female.

## 2018-04-03 ENCOUNTER — Other Ambulatory Visit: Payer: Self-pay

## 2019-10-19 IMAGING — US US RENAL
1 series · 14 of 25 positions shown · non-contrast
Comparison: CT 02/08/2017

CLINICAL DATA: Flank pain

EXAM:
RENAL / URINARY TRACT ULTRASOUND COMPLETE

[Series 1: us renal · 0.30mm/px · 14 of 38 slices shown]
[im 1/38]
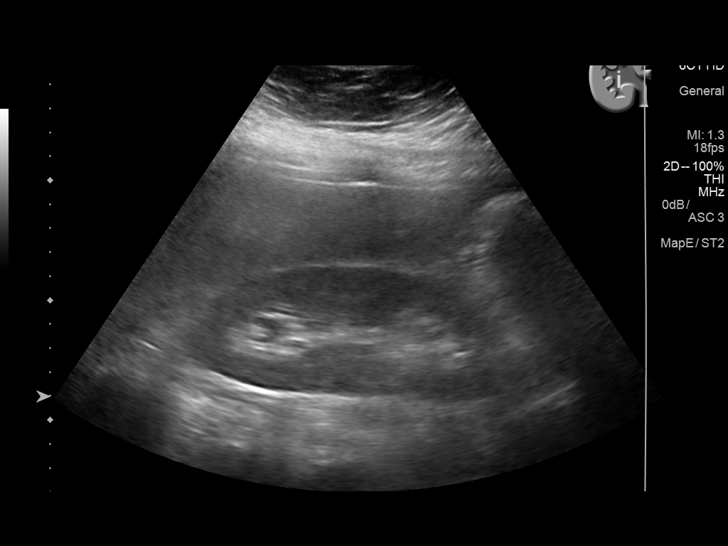
[im 4/38]
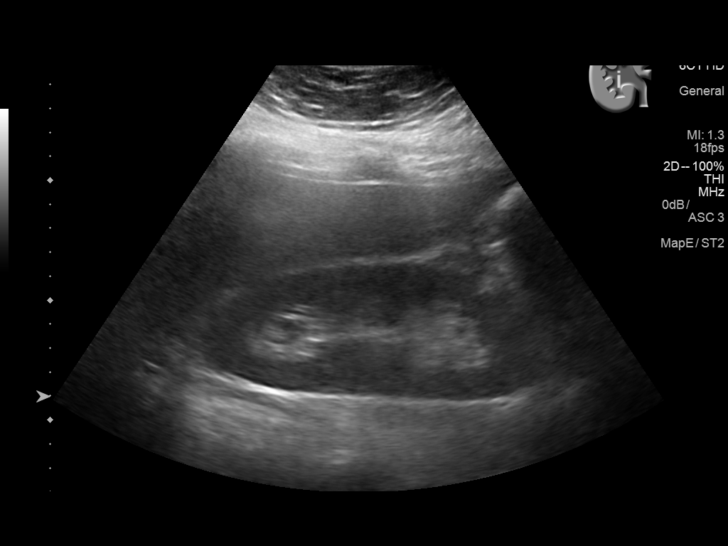
[im 7/38]
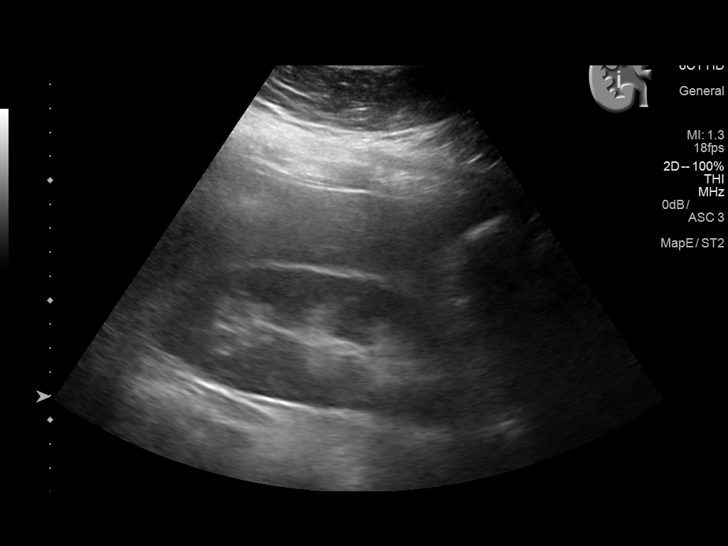
[im 10/38]
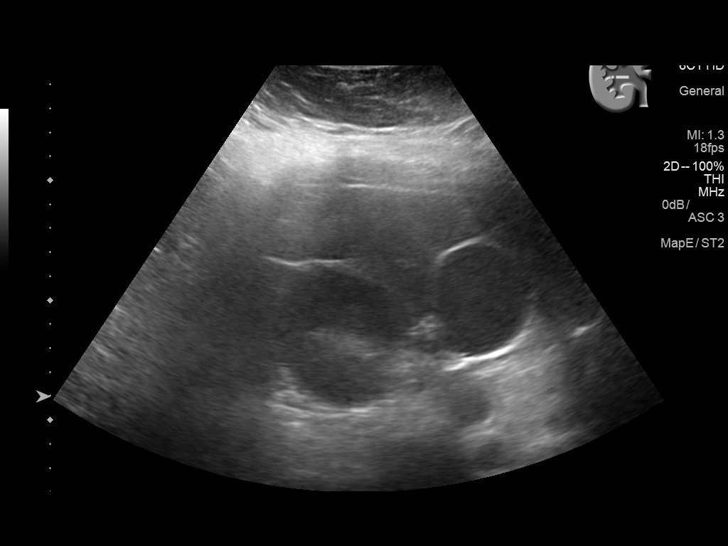
[im 13/38]
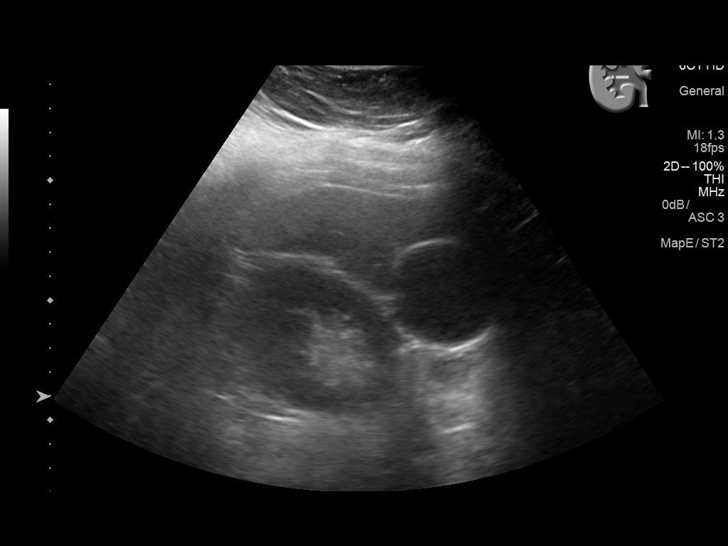
[im 14/38]
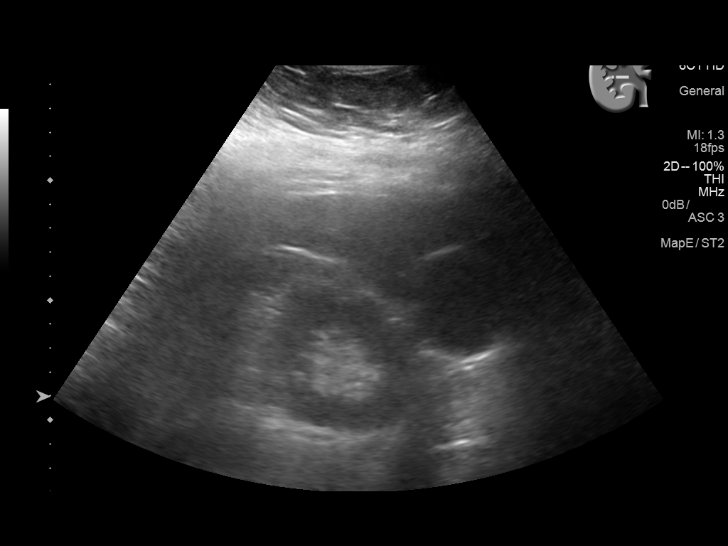
[im 17/38]
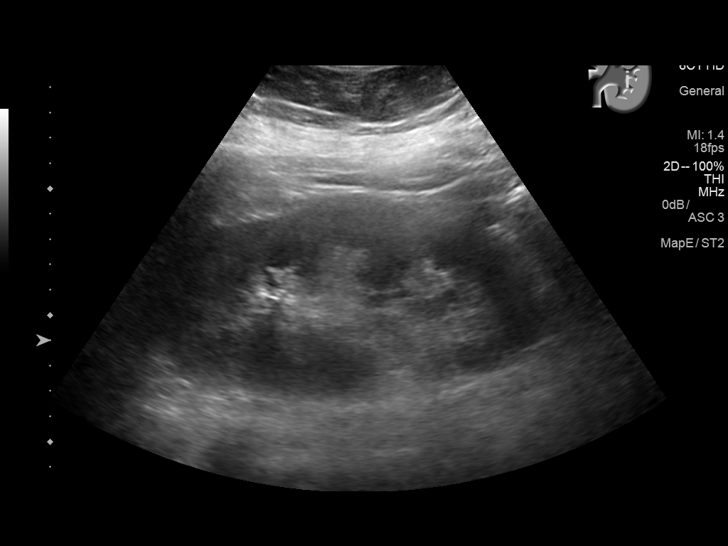
[im 21/38]
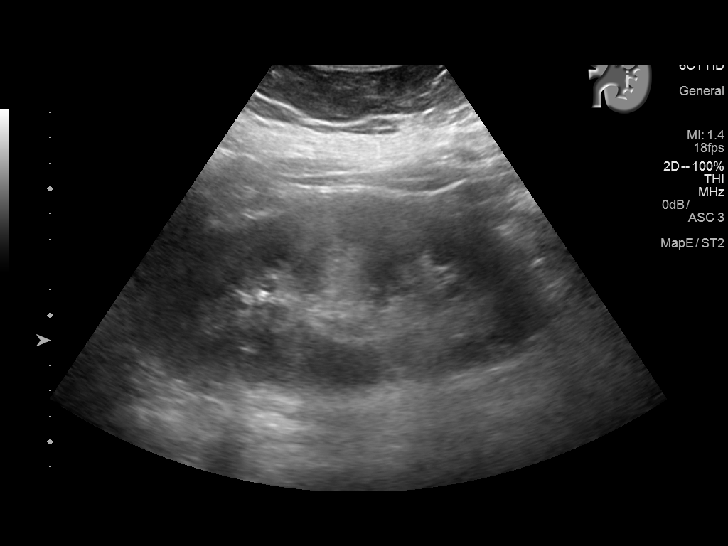
[im 24/38]
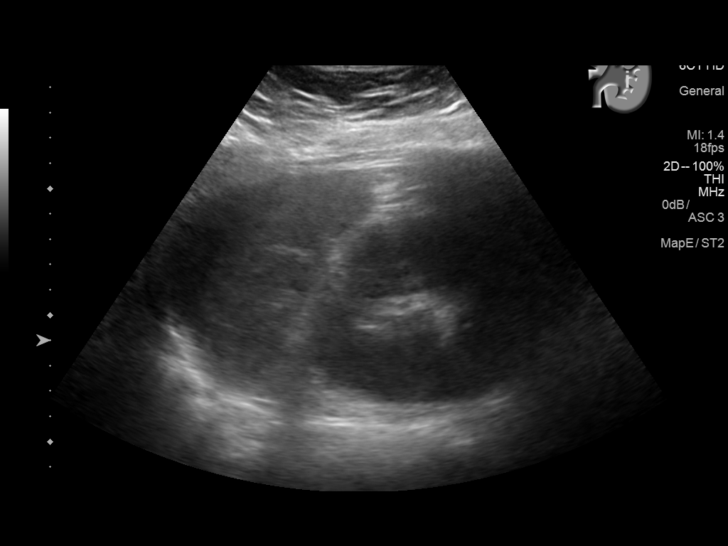
[im 25/38]
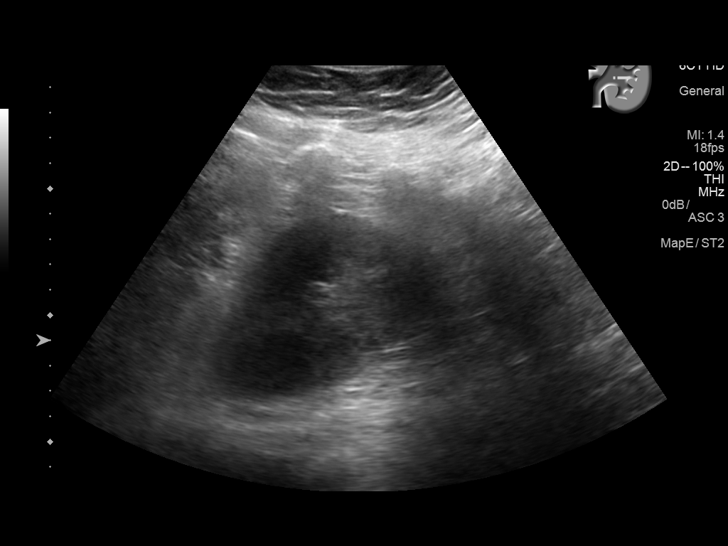
[im 28/38]
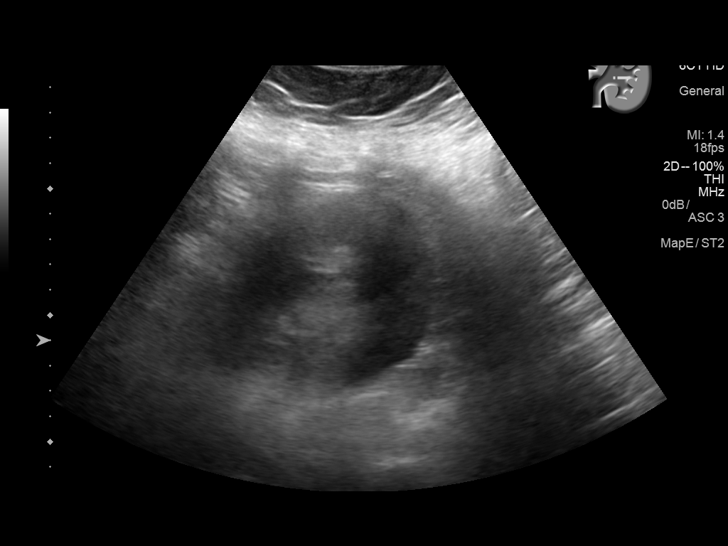
[im 31/38]
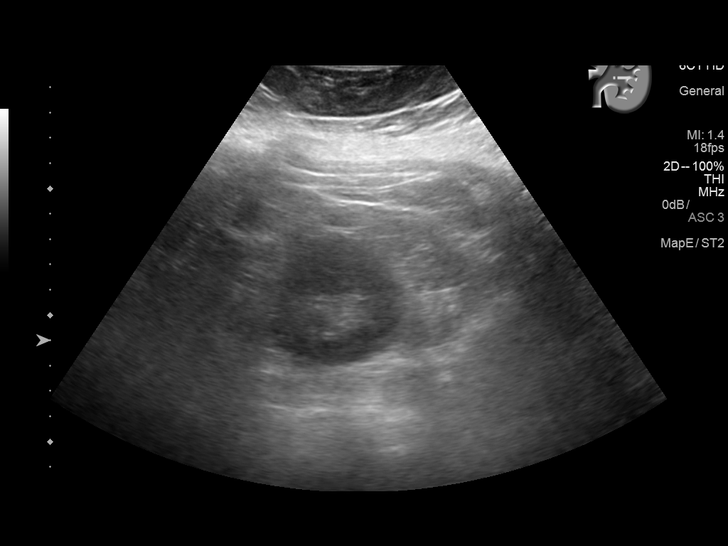
[im 34/38]
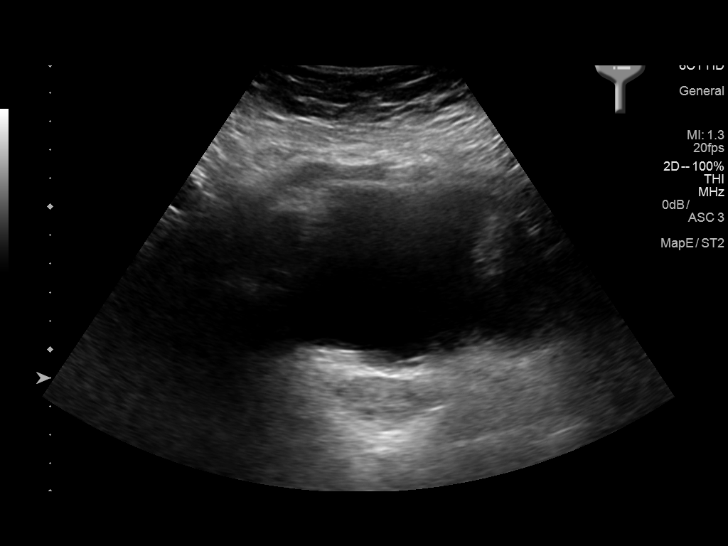
[im 38/38]
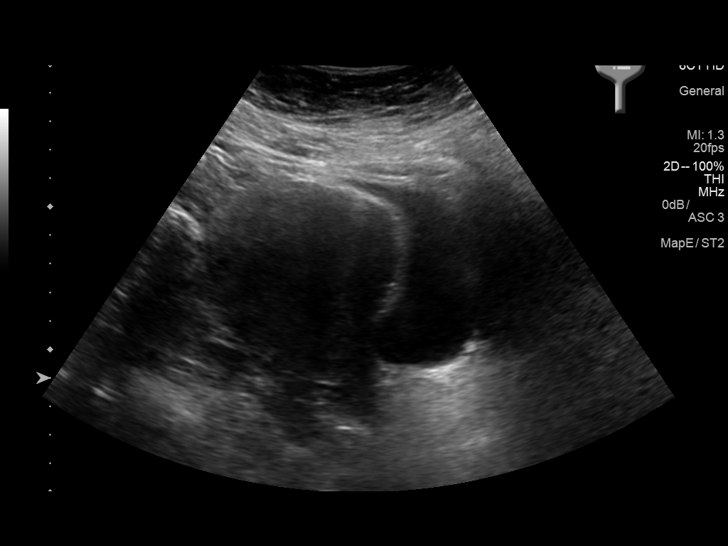

[14 of 25 positions shown; findings below may reference images not displayed]

FINDINGS: Right Kidney:

Length: 14.3 cm. Previously seen small midpole renal stone by CT not
visualized on today's ultrasound.. Echogenicity within normal
limits. No mass or hydronephrosis visualized.

Left Kidney:

Length: 16.2 cm. Shadowing 16 mm stone in the upper pole. Other
smaller calcifications, nonobstructing. No hydronephrosis. Normal
echotexture.

Bladder:

Appears normal for degree of bladder distention.
IMPRESSION: Left nephrolithiasis again noted as seen on prior CT. The punctate
right midpole renal stone seen on prior CT cannot be visualized on
today's ultrasound. No hydronephrosis.

## 2021-09-13 ENCOUNTER — Ambulatory Visit: Payer: BLUE CROSS/BLUE SHIELD | Admitting: Family Medicine

## 2021-09-15 ENCOUNTER — Ambulatory Visit: Payer: BC Managed Care – PPO | Admitting: Adult Health

## 2021-09-15 ENCOUNTER — Encounter: Payer: Self-pay | Admitting: Adult Health

## 2021-09-15 VITALS — HR 75 | Temp 99.1°F | Ht 65.5 in | Wt 276.0 lb

## 2021-09-15 DIAGNOSIS — Z1211 Encounter for screening for malignant neoplasm of colon: Secondary | ICD-10-CM

## 2021-09-15 DIAGNOSIS — Z7689 Persons encountering health services in other specified circumstances: Secondary | ICD-10-CM

## 2021-09-15 DIAGNOSIS — E063 Autoimmune thyroiditis: Secondary | ICD-10-CM

## 2021-09-15 LAB — COMPREHENSIVE METABOLIC PANEL
ALT: 26 U/L (ref 0–35)
AST: 20 U/L (ref 0–37)
Albumin: 4.6 g/dL (ref 3.5–5.2)
Alkaline Phosphatase: 109 U/L (ref 39–117)
BUN: 16 mg/dL (ref 6–23)
CO2: 23 mEq/L (ref 19–32)
Calcium: 9.9 mg/dL (ref 8.4–10.5)
Chloride: 104 mEq/L (ref 96–112)
Creatinine, Ser: 0.65 mg/dL (ref 0.40–1.20)
GFR: 103.39 mL/min (ref >60.00)
Glucose, Bld: 99 mg/dL (ref 70–99)
Potassium: 3.6 mEq/L (ref 3.5–5.1)
Sodium: 135 mEq/L (ref 135–145)
Total Bilirubin: 0.3 mg/dL (ref 0.2–1.2)
Total Protein: 7.5 g/dL (ref 6.0–8.3)

## 2021-09-15 LAB — CBC WITH DIFFERENTIAL/PLATELET
Basophils Absolute: 0.1 10*3/uL (ref 0.0–0.1)
Basophils Relative: 0.9 % (ref 0.0–3.0)
Eosinophils Absolute: 0.2 10*3/uL (ref 0.0–0.7)
Eosinophils Relative: 1.8 % (ref 0.0–5.0)
HCT: 40.7 % (ref 36.0–46.0)
Hemoglobin: 13.1 g/dL (ref 12.0–15.0)
Lymphocytes Relative: 17.7 % (ref 12.0–46.0)
Lymphs Abs: 1.5 10*3/uL (ref 0.7–4.0)
MCHC: 32.2 g/dL (ref 30.0–36.0)
MCV: 83.8 fl (ref 78.0–100.0)
Monocytes Absolute: 0.8 10*3/uL (ref 0.1–1.0)
Monocytes Relative: 9.7 % (ref 3.0–12.0)
Neutro Abs: 5.9 10*3/uL (ref 1.4–7.7)
Neutrophils Relative %: 69.9 % (ref 43.0–77.0)
Platelets: 336 10*3/uL (ref 150.0–400.0)
RBC: 4.86 Mil/uL (ref 3.87–5.11)
RDW: 15.7 % — ABNORMAL HIGH (ref 11.5–15.5)
WBC: 8.4 10*3/uL (ref 4.0–10.5)

## 2021-09-15 LAB — TSH: TSH: 4.12 u[IU]/mL (ref 0.35–5.50)

## 2021-09-15 LAB — HEMOGLOBIN A1C: Hgb A1c MFr Bld: 6.3 % (ref 4.6–6.5)

## 2021-09-15 NOTE — Progress Notes (Unsigned)
    Patient presents to clinic today to establish care. She is a pleasant 51 year old female who  has a past medical history of Hashimoto's disease and Renal disorder.  She also goes to [facility]   Acute Concerns: Establish Care   Chronic Issues:   Health Maintenance: Dental -- Does not do routine care  Vision -- Does not do routine care  Immunizations -- UTD  Colonoscopy -- Never had time  Mammogram -- Last was at age 65  PAP -- Does not have a GYN    Past Medical History:  Diagnosis Date   Hashimoto's disease    Renal disorder     Past Surgical History:  Procedure Laterality Date   CESAREAN SECTION     KNEE SURGERY     SHOULDER SURGERY      Current Outpatient Medications on File Prior to Visit  Medication Sig Dispense Refill   ibuprofen (ADVIL,MOTRIN) 200 MG tablet Take 3 200 mg tabs every 6 hrs as needed for pain 30 tablet 0   prochlorperazine (COMPAZINE) 5 MG tablet Take 1 tablet (5 mg total) by mouth every 6 (six) hours as needed for nausea or vomiting. 15 tablet 0   sulfamethoxazole-trimethoprim (BACTRIM DS,SEPTRA DS) 800-160 MG tablet Take 1 tablet by mouth 2 (two) times daily. 20 tablet 0   No current facility-administered medications on file prior to visit.    Allergies  Allergen Reactions   Acetaminophen Hives, Itching and Swelling    Hives on face and neck   Gluten Meal Swelling    Swelling on neck   Ciprofloxacin Nausea And Vomiting   Dairy Aid [Tilactase] Diarrhea and Nausea And Vomiting   Eggs Or Egg-Derived Products Diarrhea and Nausea And Vomiting   Zofran [Ondansetron Hcl] Nausea Only    No family history on file.  Social History   Socioeconomic History   Marital status: Married    Spouse name: Not on file   Number of children: Not on file   Years of education: Not on file   Highest education level: Not on file  Occupational History   Not on file  Tobacco Use   Smoking status: Never   Smokeless tobacco: Never   Vaping Use   Vaping Use: Never used  Substance and Sexual Activity   Alcohol use: Yes    Comment: occasinally   Drug use: No   Sexual activity: Not on file  Other Topics Concern   Not on file  Social History Narrative   Not on file   Social Determinants of Health   Financial Resource Strain: Not on file  Food Insecurity: Not on file  Transportation Needs: Not on file  Physical Activity: Not on file  Stress: Not on file  Social Connections: Not on file  Intimate Partner Violence: Not on file    ROS  There were no vitals taken for this visit.  Physical Exam  No results found for this or any previous visit (from the past 2160 hour(s)).  Assessment/Plan: No problem-specific Assessment & Plan notes found for this encounter.

## 2021-09-15 NOTE — Patient Instructions (Signed)
It was great meeting you today   I am going to do some labs on you today   Someone will call you to schedule your colonoscopy and GYN exam

## 2021-09-16 ENCOUNTER — Telehealth: Payer: Self-pay | Admitting: Adult Health

## 2021-09-16 MED ORDER — WEGOVY 0.25 MG/0.5ML ~~LOC~~ SOAJ: 0.2500 mg | SUBCUTANEOUS | 0 refills | Status: DC

## 2021-09-16 NOTE — Telephone Encounter (Signed)
Pt called to say she received the results of her labs via Mychart, but has questions regarding some medications NP may or may not be able to prescribe. Pt asked for a call back to discuss.  [phone]

## 2021-09-29 ENCOUNTER — Other Ambulatory Visit: Payer: Self-pay

## 2021-09-29 ENCOUNTER — Telehealth: Payer: Self-pay | Admitting: Adult Health

## 2021-09-29 MED ORDER — WEGOVY 0.25 MG/0.5ML ~~LOC~~ SOAJ: 0.2500 mg | SUBCUTANEOUS | 0 refills | Status: AC

## 2021-09-29 NOTE — Telephone Encounter (Signed)
Pt would like a written rx for Semaglutide-Weight Management (WEGOVY) 0.25 MG/0.5ML SOAJ the local pharm does not have in stock and pt will take written rx to another pharm to see if they have med in stock

## 2021-09-29 NOTE — Telephone Encounter (Signed)
Rx printed pt notified of update and is on the way to pick up Rx

## 2021-10-05 ENCOUNTER — Telehealth: Payer: Self-pay | Admitting: Adult Health

## 2021-10-05 NOTE — Telephone Encounter (Signed)
Pt still has not gotten Semaglutide-Weight Management (WEGOVY) 0.25 MG/0.5ML SOAJ insurance would not pay and was being charged $1800. Wants to know if there is a coupon to get a discount. ** sidenote- PA for [facility] had just reached the office and is being sent to provider folder for appropriate action.

## 2021-10-06 NOTE — Telephone Encounter (Signed)
Left message to return call 

## 2021-10-07 NOTE — Telephone Encounter (Signed)
Left detailed message informing  of update. 

## 2021-10-07 NOTE — Telephone Encounter (Signed)
Pt is returning kendra call 

## 2021-10-07 NOTE — Telephone Encounter (Signed)
PA started

## 2021-10-08 NOTE — Telephone Encounter (Signed)
PA was denied pt notified of update. Pt stated that she is not sure what to do. The medication is $1800 for 28 day supply. Per Kandee Keen, there are no other alternatives.

## 2021-10-08 NOTE — Telephone Encounter (Signed)
Tried to advise pt of update but no answer

## 2021-10-12 NOTE — Telephone Encounter (Signed)
Patient notified of update  and verbalized understanding. 

## 2021-10-12 NOTE — Telephone Encounter (Signed)
Left message to return phone call.

## 2021-10-13 NOTE — Telephone Encounter (Signed)
error    This encounter was created in error - please disregard.

## 2021-10-29 ENCOUNTER — Telehealth: Payer: Self-pay | Admitting: Adult Health

## 2021-10-29 NOTE — Telephone Encounter (Signed)
Calling to check progress on appeal for wegovy reference key [id number]

## 2021-10-29 NOTE — Telephone Encounter (Signed)
Lm advising pt to return call. No sure where the appeal came about. An appeal is not appropriate in her case.

## 2021-11-05 NOTE — Telephone Encounter (Signed)
Left message
